# Patient Record
Sex: Female | Born: 1985 | Race: White | Hispanic: No | Marital: Single
Health system: Southern US, Community
[De-identification: ages and names within clinical notes are randomized; demographics above are authoritative.]

---

## 2018-01-07 DIAGNOSIS — M5416 Radiculopathy, lumbar region: Secondary | ICD-10-CM | POA: Insufficient documentation

## 2019-07-19 DIAGNOSIS — S134XXA Sprain of ligaments of cervical spine, initial encounter: Secondary | ICD-10-CM | POA: Diagnosis not present

## 2019-10-18 DIAGNOSIS — M545 Low back pain, unspecified: Secondary | ICD-10-CM | POA: Insufficient documentation

## 2019-10-18 DIAGNOSIS — M961 Postlaminectomy syndrome, not elsewhere classified: Secondary | ICD-10-CM | POA: Insufficient documentation

## 2019-10-26 ENCOUNTER — Other Ambulatory Visit: Payer: Self-pay | Admitting: Orthopedic Surgery

## 2019-10-26 DIAGNOSIS — M545 Low back pain, unspecified: Secondary | ICD-10-CM

## 2019-11-13 ENCOUNTER — Ambulatory Visit
Admission: RE | Admit: 2019-11-13 | Discharge: 2019-11-13 | Disposition: A | Discharge status: Home / Self Care | Payer: Self-pay | Source: Ambulatory Visit | Attending: Orthopedic Surgery | Admitting: Orthopedic Surgery

## 2019-11-13 ENCOUNTER — Other Ambulatory Visit: Payer: Self-pay

## 2019-11-13 DIAGNOSIS — M48061 Spinal stenosis, lumbar region without neurogenic claudication: Secondary | ICD-10-CM | POA: Diagnosis not present

## 2019-11-13 DIAGNOSIS — M5126 Other intervertebral disc displacement, lumbar region: Secondary | ICD-10-CM | POA: Diagnosis not present

## 2019-11-13 DIAGNOSIS — M545 Low back pain, unspecified: Secondary | ICD-10-CM

## 2019-11-13 DIAGNOSIS — M47816 Spondylosis without myelopathy or radiculopathy, lumbar region: Secondary | ICD-10-CM | POA: Diagnosis not present

## 2019-11-13 MED ORDER — GADOBENATE DIMEGLUMINE 529 MG/ML IV SOLN
20.0000 mL | Freq: Once | INTRAVENOUS | Status: AC | PRN
  Administered 2019-11-13: 20 mL via INTRAVENOUS

## 2019-11-18 DIAGNOSIS — M545 Low back pain: Secondary | ICD-10-CM | POA: Diagnosis not present

## 2019-11-21 ENCOUNTER — Other Ambulatory Visit: Payer: Self-pay | Admitting: Orthopedic Surgery

## 2019-11-21 DIAGNOSIS — G8929 Other chronic pain: Secondary | ICD-10-CM

## 2019-12-21 DIAGNOSIS — Z20828 Contact with and (suspected) exposure to other viral communicable diseases: Secondary | ICD-10-CM | POA: Diagnosis not present

## 2020-04-10 DIAGNOSIS — M79674 Pain in right toe(s): Secondary | ICD-10-CM | POA: Diagnosis not present

## 2020-04-10 DIAGNOSIS — M5481 Occipital neuralgia: Secondary | ICD-10-CM | POA: Diagnosis not present

## 2020-04-10 DIAGNOSIS — F3341 Major depressive disorder, recurrent, in partial remission: Secondary | ICD-10-CM | POA: Diagnosis not present

## 2020-04-10 DIAGNOSIS — M5432 Sciatica, left side: Secondary | ICD-10-CM | POA: Diagnosis not present

## 2020-04-23 ENCOUNTER — Other Ambulatory Visit: Payer: Self-pay

## 2020-04-23 ENCOUNTER — Ambulatory Visit
Admission: RE | Admit: 2020-04-23 | Discharge: 2020-04-23 | Disposition: A | Discharge status: Home / Self Care | Payer: BC Managed Care – PPO | Source: Ambulatory Visit | Attending: Orthopedic Surgery | Admitting: Orthopedic Surgery

## 2020-04-23 DIAGNOSIS — M48061 Spinal stenosis, lumbar region without neurogenic claudication: Secondary | ICD-10-CM | POA: Diagnosis not present

## 2020-04-23 DIAGNOSIS — M545 Low back pain: Secondary | ICD-10-CM

## 2020-04-23 DIAGNOSIS — G8929 Other chronic pain: Secondary | ICD-10-CM

## 2020-04-23 MED ORDER — IOPAMIDOL (ISOVUE-M 200) INJECTION 41%
1.0000 mL | Freq: Once | INTRAMUSCULAR | Status: AC
  Administered 2020-04-23: 14:00:00 1 mL via EPIDURAL

## 2020-04-23 MED ORDER — METHYLPREDNISOLONE ACETATE 40 MG/ML INJ SUSP (RADIOLOG
120.0000 mg | Freq: Once | INTRAMUSCULAR | Status: AC
  Administered 2020-04-23: 14:00:00 120 mg via EPIDURAL

## 2020-04-23 NOTE — Discharge Instructions (Signed)

## 2020-05-09 DIAGNOSIS — F3341 Major depressive disorder, recurrent, in partial remission: Secondary | ICD-10-CM | POA: Diagnosis not present

## 2020-06-04 ENCOUNTER — Other Ambulatory Visit: Payer: Self-pay | Admitting: Family Medicine

## 2020-06-04 ENCOUNTER — Other Ambulatory Visit: Payer: Self-pay

## 2020-06-04 ENCOUNTER — Ambulatory Visit
Admission: RE | Admit: 2020-06-04 | Discharge: 2020-06-04 | Disposition: A | Discharge status: Home / Self Care | Payer: BLUE CROSS/BLUE SHIELD | Source: Ambulatory Visit | Attending: Family Medicine | Admitting: Family Medicine

## 2020-06-04 DIAGNOSIS — S99911A Unspecified injury of right ankle, initial encounter: Secondary | ICD-10-CM

## 2021-03-15 DIAGNOSIS — S0502XA Injury of conjunctiva and corneal abrasion without foreign body, left eye, initial encounter: Secondary | ICD-10-CM | POA: Diagnosis not present

## 2021-05-23 DIAGNOSIS — H60541 Acute eczematoid otitis externa, right ear: Secondary | ICD-10-CM | POA: Diagnosis not present

## 2021-08-24 IMAGING — XA Imaging study
1 series · 1 of 1 positions shown · non-contrast
Comparison: none

CLINICAL DATA: Previous discectomy L4-5. Disc degeneration and
postoperative changes at L4-5 with a disc
protrusion resulting in mild bilateral lateral recess stenosis.
Recurrent low back and lower extremity pain, left greater than
right.

[Series 1: ortho adipose · 1 of 1 slices shown]
[im 1/1]
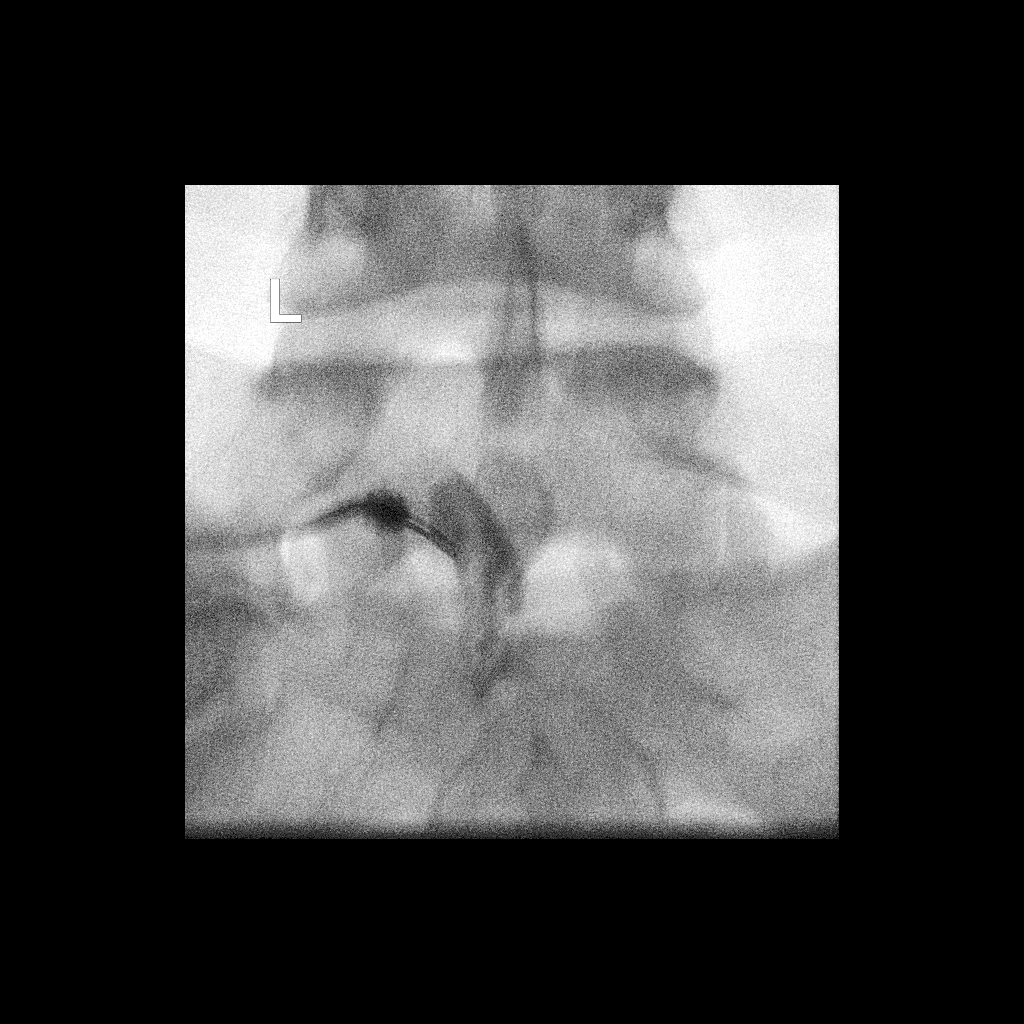

[1 of 1 positions shown; findings below may reference images not displayed]

EXAM:
LUMBAR EPIDURAL INJECTION:

DIAGNOSTIC EPIDURAL INJECTION:

THERAPEUTIC EPIDURAL INJECTION:

PROCEDURE:
The procedure, risks, benefits, and alternatives were explained to
the patient. Questions regarding the procedure were encouraged and
answered. The patient understands and consents to the procedure.

An interlaminar approach was performed on left at L5-S1. The
overlying skin was cleansed and anesthetized. A 20 gauge epidural
needle was advanced using loss-of-resistance technique.

Injection of Isovue-M 200 shows a good epidural pattern with spread
above and below the level of needle placement, primarily on the left
no vascular opacification is seen.

120mg of Depo-Medrol mixed with 2ml lidocaine 1% were instilled. The
procedure was well-tolerated, and the patient was discharged thirty
minutes following the injection in good condition.

FLUOROSCOPY TIME:  25 seconds; 32 uLym3 DAP

COMPLICATIONS:
None immediate
IMPRESSION: Technically successful epidural injection on the left at L5-S1.

## 2021-10-05 IMAGING — CR DG ANKLE COMPLETE 3+V*R*
3 series · 3 of 3 positions shown · non-contrast
Comparison: None.

CLINICAL DATA: Right ankle pain after injury, initial encounter.
Fall off step rolling ankle yesterday. Pain and swelling laterally.

EXAM:
RIGHT ANKLE - COMPLETE 3+ VIEW

[x ankle ap right]
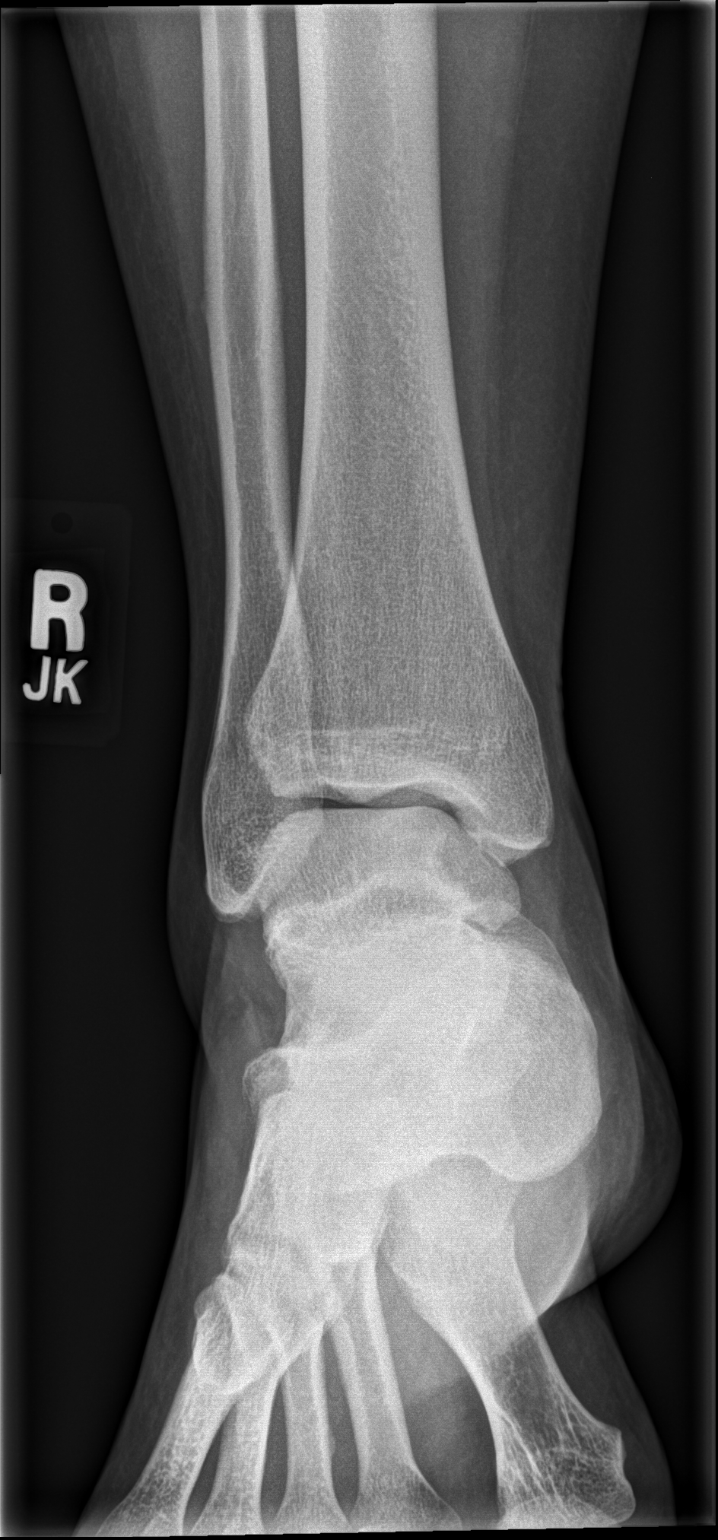

[x ankle obl right]
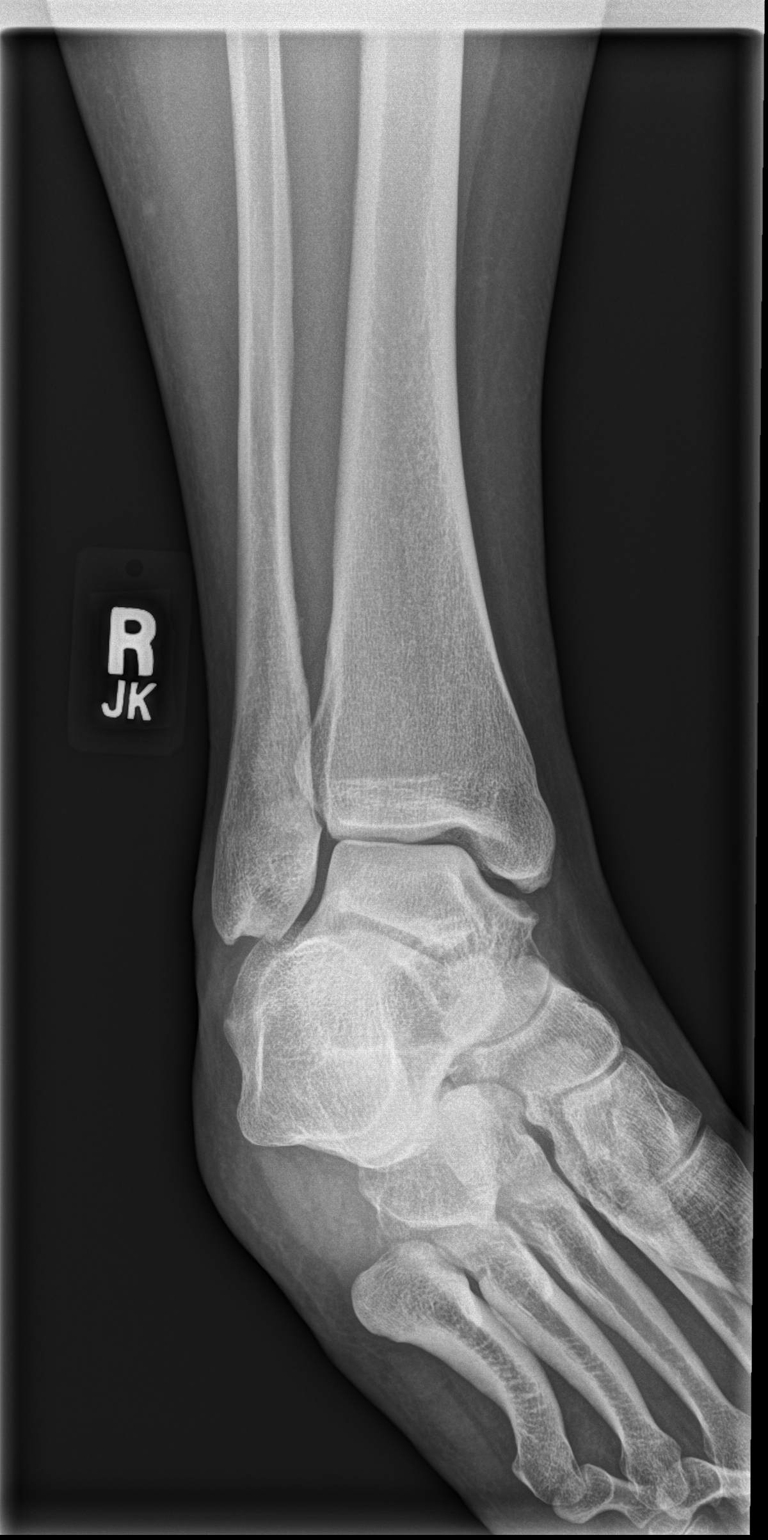

[x ankle lat right]
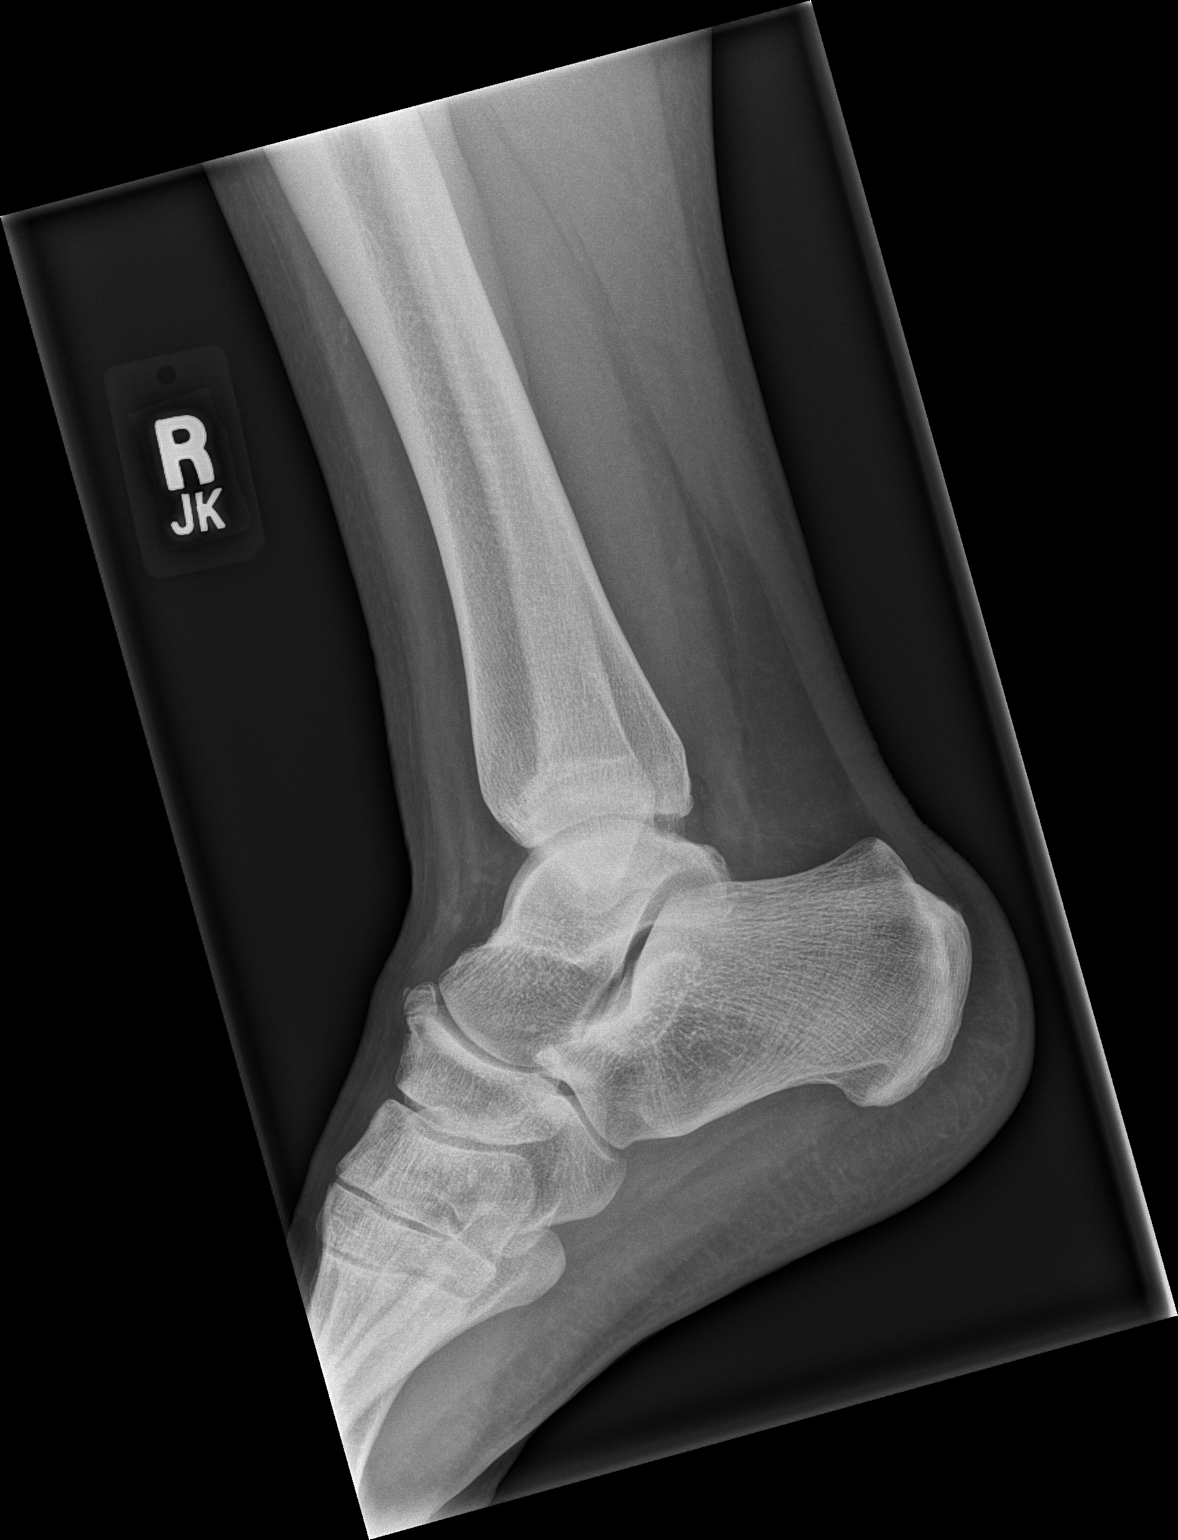

[3 of 3 positions shown; findings below may reference images not displayed]

FINDINGS: There is no evidence of fracture, dislocation, or joint effusion.
Well corticated osseous density adjacent to the dorsal navicular may
represent an accessory ossicle or sequela of prior injury. Base of
the fifth metatarsal is intact. There is no evidence of arthropathy
or other focal bone abnormality. Soft tissues are unremarkable.
IMPRESSION: No fracture or subluxation of the right ankle.

## 2021-12-02 ENCOUNTER — Other Ambulatory Visit: Payer: Self-pay | Admitting: Neurosurgery

## 2021-12-02 DIAGNOSIS — M5416 Radiculopathy, lumbar region: Secondary | ICD-10-CM

## 2022-02-13 ENCOUNTER — Ambulatory Visit: Payer: BLUE CROSS/BLUE SHIELD | Admitting: Internal Medicine
